# Patient Record
Sex: Female | Born: 1965 | Race: White | Hispanic: No | Marital: Married | Smoking: Current every day smoker
Health system: Southern US, Community
[De-identification: ages and names within clinical notes are randomized; demographics above are authoritative.]

## PROBLEM LIST (undated history)

## (undated) DIAGNOSIS — R011 Cardiac murmur, unspecified: Secondary | ICD-10-CM

## (undated) DIAGNOSIS — E119 Type 2 diabetes mellitus without complications: Secondary | ICD-10-CM

## (undated) DIAGNOSIS — I359 Nonrheumatic aortic valve disorder, unspecified: Secondary | ICD-10-CM

## (undated) DIAGNOSIS — G4733 Obstructive sleep apnea (adult) (pediatric): Secondary | ICD-10-CM

## (undated) DIAGNOSIS — E7800 Pure hypercholesterolemia, unspecified: Secondary | ICD-10-CM

## (undated) DIAGNOSIS — I10 Essential (primary) hypertension: Secondary | ICD-10-CM

## (undated) DIAGNOSIS — F419 Anxiety disorder, unspecified: Secondary | ICD-10-CM

## (undated) DIAGNOSIS — I351 Nonrheumatic aortic (valve) insufficiency: Secondary | ICD-10-CM

## (undated) HISTORY — DX: Nonrheumatic aortic valve disorder, unspecified: I35.9

## (undated) HISTORY — DX: Pure hypercholesterolemia, unspecified: E78.00

## (undated) HISTORY — DX: Type 2 diabetes mellitus without complications: E11.9

## (undated) HISTORY — PX: SHOULDER SURGERY: SHX246

## (undated) HISTORY — DX: Nonrheumatic aortic (valve) insufficiency: I35.1

## (undated) HISTORY — DX: Obstructive sleep apnea (adult) (pediatric): G47.33

## (undated) HISTORY — DX: Anxiety disorder, unspecified: F41.9

## (undated) HISTORY — DX: Essential (primary) hypertension: I10

## (undated) HISTORY — DX: Cardiac murmur, unspecified: R01.1

---

## 1999-01-13 ENCOUNTER — Other Ambulatory Visit: Admission: RE | Admit: 1999-01-13 | Discharge: 1999-01-13 | Payer: Self-pay | Admitting: Oral Surgery

## 1999-06-09 ENCOUNTER — Ambulatory Visit (HOSPITAL_COMMUNITY): Admission: RE | Admit: 1999-06-09 | Discharge: 1999-06-09 | Payer: Self-pay | Admitting: Gastroenterology

## 2000-09-22 ENCOUNTER — Other Ambulatory Visit: Admission: RE | Admit: 2000-09-22 | Discharge: 2000-09-22 | Payer: Self-pay | Admitting: Gynecology

## 2000-10-11 ENCOUNTER — Encounter: Payer: Self-pay | Admitting: Gynecology

## 2000-10-11 ENCOUNTER — Encounter: Admission: RE | Admit: 2000-10-11 | Discharge: 2000-10-11 | Payer: Self-pay | Admitting: Gynecology

## 2002-06-19 ENCOUNTER — Other Ambulatory Visit: Admission: RE | Admit: 2002-06-19 | Discharge: 2002-06-19 | Payer: Self-pay | Admitting: Gynecology

## 2004-07-09 ENCOUNTER — Other Ambulatory Visit: Admission: RE | Admit: 2004-07-09 | Discharge: 2004-07-09 | Payer: Self-pay | Admitting: Gynecology

## 2005-03-26 ENCOUNTER — Encounter: Admission: RE | Admit: 2005-03-26 | Discharge: 2005-03-26 | Payer: Self-pay | Admitting: Gynecology

## 2005-11-10 ENCOUNTER — Other Ambulatory Visit: Admission: RE | Admit: 2005-11-10 | Discharge: 2005-11-10 | Payer: Self-pay | Admitting: Gynecology

## 2006-06-03 ENCOUNTER — Encounter: Admission: RE | Admit: 2006-06-03 | Discharge: 2006-06-03 | Payer: Self-pay | Admitting: Gynecology

## 2007-05-11 ENCOUNTER — Emergency Department (HOSPITAL_COMMUNITY): Admission: EM | Admit: 2007-05-11 | Discharge: 2007-05-11 | Payer: Self-pay | Admitting: Emergency Medicine

## 2007-06-14 ENCOUNTER — Encounter: Admission: RE | Admit: 2007-06-14 | Discharge: 2007-06-14 | Payer: Self-pay | Admitting: Gynecology

## 2007-06-17 ENCOUNTER — Encounter: Admission: RE | Admit: 2007-06-17 | Discharge: 2007-06-17 | Payer: Self-pay | Admitting: Gynecology

## 2008-07-24 ENCOUNTER — Encounter: Admission: RE | Admit: 2008-07-24 | Discharge: 2008-07-24 | Payer: Self-pay | Admitting: Gynecology

## 2010-03-23 ENCOUNTER — Encounter: Payer: Self-pay | Admitting: Gynecology

## 2010-10-14 ENCOUNTER — Other Ambulatory Visit: Payer: Self-pay | Admitting: Gynecology

## 2010-10-14 DIAGNOSIS — Z1231 Encounter for screening mammogram for malignant neoplasm of breast: Secondary | ICD-10-CM

## 2010-10-17 ENCOUNTER — Ambulatory Visit
Admission: RE | Admit: 2010-10-17 | Discharge: 2010-10-17 | Disposition: A | Discharge status: Home / Self Care | Payer: Self-pay | Source: Ambulatory Visit | Attending: Gynecology | Admitting: Gynecology

## 2010-10-17 DIAGNOSIS — Z1231 Encounter for screening mammogram for malignant neoplasm of breast: Secondary | ICD-10-CM

## 2010-11-24 LAB — COMPREHENSIVE METABOLIC PANEL
ALT: 23
AST: 20
CO2: 25
Calcium: 9.9
Chloride: 108
Creatinine, Ser: 0.72
GFR calc non Af Amer: >60
Glucose, Bld: 90
Sodium: 142
Total Bilirubin: 0.4

## 2010-11-24 LAB — DIFFERENTIAL
Basophils Absolute: 0.1
Eosinophils Absolute: 0.1
Eosinophils Relative: 1
Lymphs Abs: 3.2
Neutrophils Relative %: 68

## 2010-11-24 LAB — CBC
Hemoglobin: 15
MCHC: 34.4
MCV: 91.4
RBC: 4.78
WBC: 13.1 — ABNORMAL HIGH

## 2012-04-28 ENCOUNTER — Other Ambulatory Visit: Payer: Self-pay | Admitting: Family Medicine

## 2012-04-28 ENCOUNTER — Other Ambulatory Visit (HOSPITAL_COMMUNITY)
Admission: RE | Admit: 2012-04-28 | Discharge: 2012-04-28 | Disposition: A | Discharge status: Home / Self Care | Payer: Self-pay | Source: Ambulatory Visit | Attending: Family Medicine | Admitting: Family Medicine

## 2012-04-28 DIAGNOSIS — Z01419 Encounter for gynecological examination (general) (routine) without abnormal findings: Secondary | ICD-10-CM | POA: Insufficient documentation

## 2012-11-21 ENCOUNTER — Other Ambulatory Visit: Payer: Self-pay | Admitting: Cardiology

## 2012-11-21 DIAGNOSIS — Z79899 Other long term (current) drug therapy: Secondary | ICD-10-CM

## 2012-11-21 DIAGNOSIS — E7800 Pure hypercholesterolemia, unspecified: Secondary | ICD-10-CM

## 2012-11-30 ENCOUNTER — Other Ambulatory Visit: Payer: Self-pay

## 2012-12-06 ENCOUNTER — Other Ambulatory Visit (INDEPENDENT_AMBULATORY_CARE_PROVIDER_SITE_OTHER): Payer: BC Managed Care – PPO

## 2012-12-06 DIAGNOSIS — E7800 Pure hypercholesterolemia, unspecified: Secondary | ICD-10-CM

## 2012-12-06 DIAGNOSIS — Z79899 Other long term (current) drug therapy: Secondary | ICD-10-CM

## 2012-12-06 LAB — ALT: ALT: 18 U/L (ref 0–35)

## 2012-12-07 ENCOUNTER — Telehealth: Payer: Self-pay | Admitting: General Surgery

## 2012-12-07 LAB — NMR LIPOPROFILE WITH LIPIDS
Cholesterol, Total: 83 mg/dL (ref <200)
HDL Particle Number: 31.3 umol/L (ref >=30.5)
HDL Size: 8.8 nm — ABNORMAL LOW (ref >=9.2)
HDL-C: 36 mg/dL — ABNORMAL LOW (ref >=40)
LDL (calc): 28 mg/dL (ref <100)
LDL Particle Number: 314 nmol/L (ref <1000)
LDL Size: 19.6 nm — ABNORMAL LOW (ref >20.5)
LP-IR Score: 37 (ref <=45)
Large HDL-P: <1.3 umol/L — ABNORMAL LOW (ref >=4.8)
Large VLDL-P: <0.8 nmol/L (ref <=2.7)
Small LDL Particle Number: 200 nmol/L (ref <=527)
Triglycerides: 97 mg/dL (ref <150)
VLDL Size: 43.4 nm (ref <=46.6)

## 2012-12-07 NOTE — Telephone Encounter (Signed)
LVM for pt to return call

## 2012-12-08 ENCOUNTER — Telehealth: Payer: Self-pay | Admitting: General Surgery

## 2012-12-08 ENCOUNTER — Telehealth: Payer: Self-pay | Admitting: Cardiology

## 2012-12-08 DIAGNOSIS — E7800 Pure hypercholesterolemia, unspecified: Secondary | ICD-10-CM

## 2012-12-08 DIAGNOSIS — Z79899 Other long term (current) drug therapy: Secondary | ICD-10-CM

## 2012-12-08 MED ORDER — ROSUVASTATIN CALCIUM 10 MG PO TABS: 10.0000 mg | ORAL_TABLET | Freq: Every day | ORAL | Status: DC

## 2012-12-08 NOTE — Telephone Encounter (Signed)
Pt is aware. Rx sent in for pt and lab work set up for 6 months out.

## 2012-12-08 NOTE — Telephone Encounter (Signed)
Follow Up: ° ° ° ° °Pt returning your call. °

## 2012-12-08 NOTE — Telephone Encounter (Signed)
Message copied by Nita Sells on Thu Dec 08, 2012  4:52 PM ------      Message from: SMART, Gaspar Skeeters      Created: Wed Dec 07, 2012  5:06 PM       RF: Diabetes, family h/o CAD, HTN, smoker - LDL-P goal < 1000.      Meds:  Crestor 40 mg qd, Carlson's fish oil 6 g/d.      Intolerant:  Trilipix, Niaspan.      Couldn't afford:  Lovaza.      Cholesterol has dropped significantly on Crestor 40 mg qd, and b/c cholesterol is so low, will reduce Crestor to just 1/4 pill daily (thus Crestor 10 mg daily).      ALT normal      Plan:      1.  Reduce Crestor to 10 mg qd (okay to take 1/4 pill of the 40 mg pill).      2.  Recheck lipid panel and hepatic panel (not an NMR) in 6 months.      Please notify patient, update meds, and set up labs. Thanks.       ------

## 2012-12-09 ENCOUNTER — Other Ambulatory Visit: Payer: Self-pay | Admitting: General Surgery

## 2012-12-09 MED ORDER — ROSUVASTATIN CALCIUM 10 MG PO TABS: 10.0000 mg | ORAL_TABLET | Freq: Every day | ORAL | Status: DC

## 2012-12-09 NOTE — Telephone Encounter (Signed)
Follow up     Status of refill medication that was suppose to be called in. Pharmacy  stating it was not called in.

## 2012-12-09 NOTE — Telephone Encounter (Signed)
Pt is aware. rx has been sent in. 

## 2013-02-28 ENCOUNTER — Encounter: Payer: Self-pay | Admitting: *Deleted

## 2013-02-28 ENCOUNTER — Encounter: Payer: Self-pay | Admitting: Cardiology

## 2013-02-28 DIAGNOSIS — F419 Anxiety disorder, unspecified: Secondary | ICD-10-CM | POA: Insufficient documentation

## 2013-02-28 DIAGNOSIS — G4733 Obstructive sleep apnea (adult) (pediatric): Secondary | ICD-10-CM | POA: Insufficient documentation

## 2013-02-28 DIAGNOSIS — I10 Essential (primary) hypertension: Secondary | ICD-10-CM | POA: Insufficient documentation

## 2013-02-28 DIAGNOSIS — I351 Nonrheumatic aortic (valve) insufficiency: Secondary | ICD-10-CM | POA: Insufficient documentation

## 2013-02-28 DIAGNOSIS — E7800 Pure hypercholesterolemia, unspecified: Secondary | ICD-10-CM | POA: Insufficient documentation

## 2013-02-28 DIAGNOSIS — E119 Type 2 diabetes mellitus without complications: Secondary | ICD-10-CM | POA: Insufficient documentation

## 2013-03-01 ENCOUNTER — Encounter: Payer: Self-pay | Admitting: General Surgery

## 2013-03-21 ENCOUNTER — Ambulatory Visit (INDEPENDENT_AMBULATORY_CARE_PROVIDER_SITE_OTHER): Payer: BC Managed Care – PPO | Admitting: Cardiology

## 2013-03-21 ENCOUNTER — Encounter: Payer: Self-pay | Admitting: Cardiology

## 2013-03-21 VITALS — BP 138/76 | HR 82 | Ht 62.0 in | Wt 163.0 lb

## 2013-03-21 DIAGNOSIS — E7800 Pure hypercholesterolemia, unspecified: Secondary | ICD-10-CM

## 2013-03-21 DIAGNOSIS — I10 Essential (primary) hypertension: Secondary | ICD-10-CM

## 2013-03-21 DIAGNOSIS — I359 Nonrheumatic aortic valve disorder, unspecified: Secondary | ICD-10-CM

## 2013-03-21 DIAGNOSIS — G4733 Obstructive sleep apnea (adult) (pediatric): Secondary | ICD-10-CM

## 2013-03-21 DIAGNOSIS — I351 Nonrheumatic aortic (valve) insufficiency: Secondary | ICD-10-CM

## 2013-03-21 NOTE — Progress Notes (Signed)
[address] [place], [place]  [address] Phone: [phone]) [phone] Fax:  [phone]) [phone]  Date:  03/21/2013   ID:  Cheryl Schultz Pocock, DOB 1965-12-03, MRN [id number]  PCP:  Irving CopasHACKER,ROBERT KELLER, MD  Cardiologist:  Armanda Magicraci Nihar Klus, MD   History of Present Illness: Cheryl Schultz Kendall is a 48 y.o. female with mild to moderate AI, mild OSA on CPAP and HTN who presents today for followup.  She is doing well.  She tolerates her CPAP device well.  She tolerates her full face mask and feels the pressure is adequate.  She feels rested in the am and has no daytime sleepiness.  She denies any chest pain, SOB, DOE, LE edema, palpitations or syncope.   Wt Readings from Last 3 Encounters:  03/21/13 163 lb (73.936 kg)  09/22/12 167 lb 9.6 oz (76.023 kg)     Past Medical History  Diagnosis Date  . Hypercholesteremia   . Anxiety   . OSA (obstructive sleep apnea)     mlid obstructive sleep apnea with AHI 11.7/hr and titrated on CPAP to 11cm H2O  . HTN (hypertension)   . Diabetes   . Aortic insufficiency - mild to moderate     Current Outpatient Prescriptions  Medication Sig Dispense Refill  . amLODipine (NORVASC) 10 MG tablet Take 10 mg by mouth daily.      Marland Kitchen. aspirin 81 MG tablet Take 81 mg by mouth daily.      . Calcium Citrate-Vitamin D (CITRACAL + D PO) Take 2 tablets by mouth daily.      . Cholecalciferol (VITAMIN D) 2000 UNITS CAPS Take by mouth.      . estradiol (VIVELLE-DOT) 0.1 MG/24HR patch Place 1 patch onto the skin 2 (two) times a week.      . fluticasone (FLONASE) 50 MCG/ACT nasal spray Place into both nostrils as needed.       Marland Kitchen. ibuprofen (ADVIL,MOTRIN) 200 MG tablet Take 200 mg by mouth every 6 (six) hours as needed.      . progesterone (PROMETRIUM) 200 MG capsule Take 200 mg by mouth daily. One a day the first 12 days of every other month.      . ramipril (ALTACE) 10 MG capsule Take 10 mg by mouth daily.      . rosuvastatin (CRESTOR) 10 MG tablet Take 1 tablet (10 mg total) by mouth  daily.  30 tablet  11   No current facility-administered medications for this visit.    Allergies:    Allergies  Allergen Reactions  . Sulfa Antibiotics Hives and Shortness Of Breath  . Trilipix [Choline Fenofibrate] Hives and Shortness Of Breath  . Niaspan [Niacin Er] Hives    Social History:  The patient  reports that she has been smoking Cigarettes.  She has been smoking about 1.00 pack per day. She does not have any smokeless tobacco history on file. She reports that she does not drink alcohol or use illicit drugs.   Family History:  The patient's family history includes Diabetes in her father; Heart disease in her father; Hyperlipidemia in her father.   ROS:  Please see the history of present illness.      All other systems reviewed and negative.   PHYSICAL EXAM: VS:  BP 138/76  Pulse 82  Ht 5\' 2"  (1.575 m)  Wt 163 lb (73.936 kg)  BMI 29.81 kg/m2 Well nourished, well developed, in no acute distress HEENT: normal Neck: no JVD Cardiac:  normal S1, S2; RRR; no murmur Lungs:  clear to auscultation bilaterally, no wheezing, rhonchi or rales Abd: soft, nontender, no hepatomegaly Ext: no edema Skin: warm and dry Neuro:  CNs 2-12 intact, no focal abnormalities noted       ASSESSMENT AND PLAN:  1. OSA on CPAP  - I will get a download from her DME 2. Mild to moderate AI   - scheduled for echo next month 3.  HTN   - continue amlodipine/ramipril  - check BMET 4.  Obesity 5.  Dyslipidemia - at goal  - continue Crestor  followup with me in 6 months  Signed, Armanda Magic, MD 03/21/2013 4:01 PM

## 2013-03-21 NOTE — Patient Instructions (Addendum)
Your physician recommends that you continue on your current medications as directed. Please refer to the Current Medication list given to you today.  We Sent a request to [facility] stating we need a download for your CPAP machine  Your physician recommends that you go to the lab today for a BMET  Your physician wants you to follow-up in: 6 Months with Dr Sherlyn Lickurner You will receive a reminder letter in the mail two months in advance. If you don't receive a letter, please call our office to schedule the follow-up appointment.

## 2013-03-22 LAB — BASIC METABOLIC PANEL
BUN: 16 mg/dL (ref 6–23)
CHLORIDE: 107 meq/L (ref 96–112)
CO2: 28 meq/L (ref 19–32)
Calcium: 9.8 mg/dL (ref 8.4–10.5)
Creatinine, Ser: 0.9 mg/dL (ref 0.4–1.2)
GFR: 74.78 mL/min (ref >60.00)
Glucose, Bld: 99 mg/dL (ref 70–99)
POTASSIUM: 5.1 meq/L (ref 3.5–5.1)
SODIUM: 141 meq/L (ref 135–145)

## 2013-03-23 ENCOUNTER — Encounter: Payer: Self-pay | Admitting: General Surgery

## 2013-04-14 ENCOUNTER — Telehealth: Payer: Self-pay | Admitting: Cardiology

## 2013-04-14 MED ORDER — RAMIPRIL 10 MG PO CAPS: 10.0000 mg | ORAL_CAPSULE | Freq: Every day | ORAL | Status: DC

## 2013-04-14 NOTE — Telephone Encounter (Signed)
New Prob    Pt has a questions regarding her medication. Please call.

## 2013-04-14 NOTE — Telephone Encounter (Signed)
Number listed in message has been d/ced.  Called other number and left message for pt to call back to discuss concerns about her medications.

## 2013-04-14 NOTE — Telephone Encounter (Signed)
Needs refill on Ramipril 10 mg

## 2013-04-14 NOTE — Telephone Encounter (Signed)
Follow up ° ° ° ° °Returning a nurses call °

## 2013-04-18 ENCOUNTER — Other Ambulatory Visit (HOSPITAL_COMMUNITY): Payer: BC Managed Care – PPO

## 2013-05-05 ENCOUNTER — Ambulatory Visit (HOSPITAL_COMMUNITY): Payer: BC Managed Care – PPO | Attending: Cardiology | Admitting: Radiology

## 2013-05-05 ENCOUNTER — Other Ambulatory Visit (HOSPITAL_COMMUNITY): Payer: Self-pay | Admitting: Radiology

## 2013-05-05 ENCOUNTER — Encounter: Payer: Self-pay | Admitting: Cardiology

## 2013-05-05 ENCOUNTER — Other Ambulatory Visit (HOSPITAL_COMMUNITY): Payer: BC Managed Care – PPO

## 2013-05-05 DIAGNOSIS — R011 Cardiac murmur, unspecified: Secondary | ICD-10-CM

## 2013-05-05 DIAGNOSIS — I359 Nonrheumatic aortic valve disorder, unspecified: Secondary | ICD-10-CM

## 2013-05-05 NOTE — Progress Notes (Signed)
Echocardiogram Performed. 

## 2013-05-08 ENCOUNTER — Encounter: Payer: Self-pay | Admitting: Cardiology

## 2013-05-08 ENCOUNTER — Encounter: Payer: Self-pay | Admitting: General Surgery

## 2013-06-08 ENCOUNTER — Other Ambulatory Visit: Payer: BC Managed Care – PPO

## 2013-06-08 ENCOUNTER — Other Ambulatory Visit: Payer: Self-pay | Admitting: Obstetrics and Gynecology

## 2013-07-28 ENCOUNTER — Other Ambulatory Visit (INDEPENDENT_AMBULATORY_CARE_PROVIDER_SITE_OTHER): Payer: BC Managed Care – PPO

## 2013-07-28 DIAGNOSIS — Z79899 Other long term (current) drug therapy: Secondary | ICD-10-CM

## 2013-07-28 DIAGNOSIS — E7800 Pure hypercholesterolemia, unspecified: Secondary | ICD-10-CM

## 2013-07-28 LAB — LIPID PANEL
CHOL/HDL RATIO: 3
Cholesterol: 121 mg/dL (ref 0–200)
HDL: 42.4 mg/dL (ref >39.00)
LDL CALC: 44 mg/dL (ref 0–99)
TRIGLYCERIDES: 173 mg/dL — AB (ref 0.0–149.0)
VLDL: 34.6 mg/dL (ref 0.0–40.0)

## 2013-07-28 LAB — HEPATIC FUNCTION PANEL
ALBUMIN: 4 g/dL (ref 3.5–5.2)
ALT: 22 U/L (ref 0–35)
AST: 16 U/L (ref 0–37)
Alkaline Phosphatase: 37 U/L — ABNORMAL LOW (ref 39–117)
Bilirubin, Direct: 0 mg/dL (ref 0.0–0.3)
Total Bilirubin: 0.3 mg/dL (ref 0.2–1.2)
Total Protein: 6.9 g/dL (ref 6.0–8.3)

## 2013-08-10 ENCOUNTER — Telehealth: Payer: Self-pay | Admitting: Cardiology

## 2013-08-10 DIAGNOSIS — E7800 Pure hypercholesterolemia, unspecified: Secondary | ICD-10-CM

## 2013-08-10 NOTE — Telephone Encounter (Signed)
meds updated and labs ordered.

## 2013-08-10 NOTE — Telephone Encounter (Signed)
Message copied by Theda Sers on Thu Aug 10, 2013  9:34 AM ------      Message from: SMART, Gaspar Skeeters      Created: Tue Aug 01, 2013 10:04 AM       RF: Diabetes, family h/o CAD, HTN, smoker - LDL-P goal < 1000.      Meds:  Crestor 10 mg qd, Carlson's fish oil 6 g/d.      Intolerant:  Trilipix, Niaspan.      Couldn't afford:  Lovaza.      Crestor reduced from 40 mg down to 10 mg qd six months ago due to LDL of 28, and LDL-P 314 at that time.        LDL still well controlled on Crestor 10 mg qd, and non-HDL < 100.   TG trending up slightly, will watch this.      Plan:      1.  No change in meds.  Make sure she is taking fish oil 6 per day.      2.  Limit sugar and carbs in diet.      3.  Recheck lipid panel and hepatic panel in 6 months.      Please notify patient,  And set up labs. Thanks.       ------

## 2013-10-06 ENCOUNTER — Other Ambulatory Visit: Payer: Self-pay | Admitting: *Deleted

## 2013-10-06 MED ORDER — AMLODIPINE BESYLATE 10 MG PO TABS: 10.0000 mg | ORAL_TABLET | Freq: Every day | ORAL | Status: DC

## 2013-10-11 ENCOUNTER — Encounter: Payer: Self-pay | Admitting: Cardiology

## 2013-12-04 ENCOUNTER — Other Ambulatory Visit: Payer: Self-pay | Admitting: *Deleted

## 2013-12-04 MED ORDER — AMLODIPINE BESYLATE 10 MG PO TABS: 10.0000 mg | ORAL_TABLET | Freq: Every day | ORAL | Status: DC

## 2013-12-04 MED ORDER — ROSUVASTATIN CALCIUM 10 MG PO TABS: 10.0000 mg | ORAL_TABLET | Freq: Every day | ORAL | Status: DC

## 2013-12-19 ENCOUNTER — Ambulatory Visit: Payer: BC Managed Care – PPO | Admitting: Cardiology

## 2014-01-03 ENCOUNTER — Other Ambulatory Visit: Payer: Self-pay | Admitting: Cardiology

## 2014-01-04 ENCOUNTER — Other Ambulatory Visit: Payer: Self-pay

## 2014-01-04 MED ORDER — ROSUVASTATIN CALCIUM 10 MG PO TABS: 10.0000 mg | ORAL_TABLET | Freq: Every day | ORAL | Status: DC

## 2014-01-04 MED ORDER — AMLODIPINE BESYLATE 10 MG PO TABS: 10.0000 mg | ORAL_TABLET | Freq: Every day | ORAL | Status: DC

## 2014-01-12 ENCOUNTER — Encounter: Payer: Self-pay | Admitting: Cardiology

## 2014-01-12 ENCOUNTER — Ambulatory Visit (INDEPENDENT_AMBULATORY_CARE_PROVIDER_SITE_OTHER): Payer: BC Managed Care – PPO | Admitting: *Deleted

## 2014-01-12 ENCOUNTER — Ambulatory Visit (INDEPENDENT_AMBULATORY_CARE_PROVIDER_SITE_OTHER): Payer: BC Managed Care – PPO | Admitting: Cardiology

## 2014-01-12 VITALS — BP 130/72 | HR 89 | Ht 62.0 in | Wt 159.6 lb

## 2014-01-12 DIAGNOSIS — G4733 Obstructive sleep apnea (adult) (pediatric): Secondary | ICD-10-CM

## 2014-01-12 DIAGNOSIS — I10 Essential (primary) hypertension: Secondary | ICD-10-CM

## 2014-01-12 DIAGNOSIS — E7800 Pure hypercholesterolemia, unspecified: Secondary | ICD-10-CM

## 2014-01-12 DIAGNOSIS — Z23 Encounter for immunization: Secondary | ICD-10-CM

## 2014-01-12 DIAGNOSIS — I351 Nonrheumatic aortic (valve) insufficiency: Secondary | ICD-10-CM

## 2014-01-12 NOTE — Progress Notes (Signed)
[address] [place], [place]  [address] Phone: [phone] Fax:  [phone]  Date:  01/12/2014   ID:  Cheryl Carpeniane Flax, DOB 22-May-1965, MRN [id number]  PCP:  Irving CopasHACKER,ROBERT KELLER, MD  Cardiologist:  Armanda Magicraci Yasaman Kolek, MD    History of Present Illness: Cheryl CarpenDiane Schultz is a 48 y.o. female with mild AI, mild OSA on CPAP, dyslipidemia and HTN who presents today for followup. She is doing well. She tolerates her CPAP device well. She tolerates her full face mask and feels the pressure is adequate. She feels rested in the am for the most part and has no daytime sleepiness. She denies any chest pain, SOB, DOE, LE edema, palpitations or syncope.   Wt Readings from Last 3 Encounters:  01/12/14 159 lb 9.6 oz (72.394 kg)  03/21/13 163 lb (73.936 kg)  09/22/12 167 lb 9.6 oz (76.023 kg)     Past Medical History  Diagnosis Date  . Hypercholesteremia   . Anxiety   . OSA (obstructive sleep apnea)     mlid obstructive sleep apnea with AHI 11.7/hr and titrated on CPAP to 11cm H2O  . HTN (hypertension)   . Aortic valve disorder   . Heart murmur   . Diabetes   . Aortic insufficiency     mild by echo 3/15    Current Outpatient Prescriptions  Medication Sig Dispense Refill  . amLODipine (NORVASC) 10 MG tablet Take 1 tablet (10 mg total) by mouth daily. 30 tablet 0  . Calcium Citrate-Vitamin D (CITRACAL + D PO) Take 2 tablets by mouth daily.    . Cholecalciferol (VITAMIN D) 2000 UNITS CAPS Take by mouth.    . fluticasone (FLONASE) 50 MCG/ACT nasal spray Place into both nostrils as needed.     Marland Kitchen. ibuprofen (ADVIL,MOTRIN) 200 MG tablet Take 200 mg by mouth every 6 (six) hours as needed.    . Omega-3 Fatty Acids (FISH OIL) 1000 MG CAPS Take 7,000 mg by mouth daily.    Marland Kitchen. PARoxetine (PAXIL) 20 MG tablet Take 20 mg by mouth daily.    . ramipril (ALTACE) 10 MG capsule Take 1 capsule (10 mg total) by mouth daily. 30 capsule 11  . rosuvastatin (CRESTOR) 10 MG tablet Take 1 tablet (10 mg  total) by mouth daily. 30 tablet 0   No current facility-administered medications for this visit.    Allergies:    Allergies  Allergen Reactions  . Sulfa Antibiotics Hives and Shortness Of Breath  . Trilipix [Choline Fenofibrate] Hives and Shortness Of Breath  . Niaspan [Niacin Er] Hives    Social History:  The patient  reports that she has been smoking Cigarettes.  She has been smoking about 1.00 pack per day. She does not have any smokeless tobacco history on file. She reports that she does not drink alcohol or use illicit drugs.   Family History:  The patient's family history includes Diabetes in her father; Heart disease in her father; Hyperlipidemia in her father.   ROS:  Please see the history of present illness.      All other systems reviewed and negative.   PHYSICAL EXAM: VS:  BP 130/72 mmHg  Pulse 89  Ht 5\' 2"  (1.575 m)  Wt 159 lb 9.6 oz (72.394 kg)  BMI 29.18 kg/m2  SpO2 99% Well nourished, well developed, in no acute distress HEENT: normal Neck: no JVD Cardiac:  normal S1, S2; RRR; 1/6 SM at RUSB Lungs:  clear to auscultation bilaterally, no wheezing, rhonchi or rales Abd:  soft, nontender, no hepatomegaly Ext: no edema Skin: warm and dry Neuro:  CNs 2-12 intact, no focal abnormalities noted  ASSESSMENT AND PLAN:  1.  OSA on CPAP - I will get a download from her DME 2.  Mild  AI/MR by echo 3/15 3. HTN- well controlled - continue amlodipine/ramipril - check BMET 4. Obesity 5. Dyslipidemia - LDL at goal - continue Crestor             - check FLP and ALT 6.  She received flu shot in our office today  followup with me in 6 months      Signed, Armanda Magicraci Delany Steury, MD [facility] 01/12/2014 3:41 PM

## 2014-01-12 NOTE — Patient Instructions (Signed)
Your physician wants you to follow-up in: 6 months with Dr. Turner. You will receive a reminder letter in the mail two months in advance. If you don't receive a letter, please call our office to schedule the follow-up appointment.  

## 2014-01-31 ENCOUNTER — Other Ambulatory Visit (INDEPENDENT_AMBULATORY_CARE_PROVIDER_SITE_OTHER): Payer: BC Managed Care – PPO | Admitting: *Deleted

## 2014-01-31 DIAGNOSIS — E7800 Pure hypercholesterolemia, unspecified: Secondary | ICD-10-CM

## 2014-01-31 DIAGNOSIS — I10 Essential (primary) hypertension: Secondary | ICD-10-CM

## 2014-02-01 ENCOUNTER — Other Ambulatory Visit: Payer: Self-pay | Admitting: Cardiology

## 2014-02-01 LAB — LIPID PANEL
CHOLESTEROL: 138 mg/dL (ref 0–200)
HDL: 35.2 mg/dL — ABNORMAL LOW (ref >39.00)
NonHDL: 102.8
Total CHOL/HDL Ratio: 4
Triglycerides: 204 mg/dL — ABNORMAL HIGH (ref 0.0–149.0)
VLDL: 40.8 mg/dL — ABNORMAL HIGH (ref 0.0–40.0)

## 2014-02-01 LAB — HEPATIC FUNCTION PANEL
ALK PHOS: 44 U/L (ref 39–117)
ALT: 25 U/L (ref 0–35)
AST: 21 U/L (ref 0–37)
Albumin: 4.1 g/dL (ref 3.5–5.2)
BILIRUBIN DIRECT: 0.1 mg/dL (ref 0.0–0.3)
BILIRUBIN TOTAL: 0.5 mg/dL (ref 0.2–1.2)
TOTAL PROTEIN: 6.8 g/dL (ref 6.0–8.3)

## 2014-02-01 LAB — LDL CHOLESTEROL, DIRECT: Direct LDL: 64.7 mg/dL

## 2014-02-01 LAB — BASIC METABOLIC PANEL
BUN: 21 mg/dL (ref 6–23)
CO2: 27 mEq/L (ref 19–32)
CREATININE: 0.7 mg/dL (ref 0.4–1.2)
Calcium: 9.2 mg/dL (ref 8.4–10.5)
Chloride: 104 mEq/L (ref 96–112)
GFR: 90.02 mL/min (ref >60.00)
Glucose, Bld: 106 mg/dL — ABNORMAL HIGH (ref 70–99)
Potassium: 3.5 mEq/L (ref 3.5–5.1)
Sodium: 135 mEq/L (ref 135–145)

## 2014-02-02 ENCOUNTER — Telehealth: Payer: Self-pay | Admitting: Cardiology

## 2014-02-02 MED ORDER — AMLODIPINE BESYLATE 10 MG PO TABS: 10.0000 mg | ORAL_TABLET | Freq: Every day | ORAL | Status: DC

## 2014-02-02 MED ORDER — ROSUVASTATIN CALCIUM 10 MG PO TABS: 10.0000 mg | ORAL_TABLET | Freq: Every day | ORAL | Status: DC

## 2014-02-02 NOTE — Telephone Encounter (Signed)
Returned patient's call. West PughKati Kemp RN called patient earlier to let her know about her labs. Reported to patient the doctor's note as follows and her results:   Notes Recorded by Quintella Reichertraci R Turner, MD on 02/01/2014 at 4:33 PM Lipids still not at goal. Please forward to lipid clinic for further recommendations.  Spoke with Weston BrassSally Putt the pharmacist in lipid clinic, she will contact patient next week and okay to continue with current medications. Patient requested for refills on Crestor and Amlodipine. Refills sent to pharmacy of patient's choice.  Will forward to Audrie LiaSally Earl Pharmacist.  Cindi CarbonPamela Pate RN

## 2014-02-02 NOTE — Telephone Encounter (Signed)
New Msg   Pt returning call  States to contact her after lunch hrs at  [phone].

## 2014-02-05 NOTE — Telephone Encounter (Signed)
Spoke with pt.  Discussed lipid panel results.  Based on history, LDL goal <130 and non-HDL goal<160.  SZOXW<960he is currently meeting both of these on Crestor 10mg  daily.  She ate extra desserts on Thanksgiving which likely contributed to her elevated TG.  Will continue her same medications and follow up in 6 months.

## 2014-02-08 ENCOUNTER — Telehealth: Payer: Self-pay | Admitting: Cardiology

## 2014-02-08 NOTE — Telephone Encounter (Signed)
New message ° ° ° ° °Returning Katy's call °

## 2014-02-09 NOTE — Telephone Encounter (Signed)
I left a message on the patient's identified voice mail about her lipid results (triglycerides 204) and that I was forwarding to Audrie LiaSally Earl, Pharm D for review. We will call her back with further recommendations once reviewed. I asked that she call back with any questions in the interim.

## 2014-03-01 ENCOUNTER — Telehealth: Payer: Self-pay | Admitting: Cardiology

## 2014-03-01 DIAGNOSIS — E7800 Pure hypercholesterolemia, unspecified: Secondary | ICD-10-CM

## 2014-03-01 NOTE — Telephone Encounter (Signed)
Patient informed of results and verbal understanding expressed.  Fasting lab appointment scheduled for May 31.

## 2014-03-01 NOTE — Telephone Encounter (Signed)
New Msg         Pt states she was informed to call today. Please call back.

## 2014-03-01 NOTE — Telephone Encounter (Signed)
-----   Message from Evie LacksSally R Earl, [facility] sent at 02/12/2014 11:56 AM EST ----- Discussed results with pt on 12/7 (see phone note).  TG likely elevated due to dietary changes.  LDL and non-HDL still at goal.  Would suggest continuing current therapy and rechecking labs in 6 months.

## 2014-04-09 ENCOUNTER — Other Ambulatory Visit: Payer: Self-pay | Admitting: Cardiology

## 2014-06-05 ENCOUNTER — Other Ambulatory Visit: Payer: Self-pay | Admitting: Obstetrics and Gynecology

## 2014-06-07 LAB — CYTOLOGY - PAP

## 2014-07-11 ENCOUNTER — Ambulatory Visit: Payer: Self-pay | Admitting: Podiatrist

## 2014-07-26 ENCOUNTER — Encounter: Payer: Self-pay | Admitting: Podiatry

## 2014-07-26 ENCOUNTER — Ambulatory Visit (INDEPENDENT_AMBULATORY_CARE_PROVIDER_SITE_OTHER): Payer: 59

## 2014-07-26 ENCOUNTER — Ambulatory Visit (INDEPENDENT_AMBULATORY_CARE_PROVIDER_SITE_OTHER): Payer: 59 | Admitting: Podiatry

## 2014-07-26 VITALS — BP 134/76 | HR 82 | Resp 12

## 2014-07-26 DIAGNOSIS — L84 Corns and callosities: Secondary | ICD-10-CM

## 2014-07-26 DIAGNOSIS — M216X9 Other acquired deformities of unspecified foot: Secondary | ICD-10-CM

## 2014-07-26 DIAGNOSIS — M779 Enthesopathy, unspecified: Secondary | ICD-10-CM | POA: Diagnosis not present

## 2014-07-26 DIAGNOSIS — Q667 Congenital pes cavus: Secondary | ICD-10-CM | POA: Diagnosis not present

## 2014-07-26 NOTE — Progress Notes (Signed)
   Subjective:    Patient ID: Cheryl Schultz, female    DOB: 1965-12-17, 49 y.o.   MRN: [id number]  HPI Calluses on Left foot and great toe, long term issue but bottom of the foot is causing pain   Review of Systems  HENT: Positive for sinus pressure.   Respiratory: Positive for apnea.   Cardiovascular: Positive for palpitations.  Neurological: Positive for headaches.       Objective:   Physical Exam        Assessment & Plan:

## 2014-07-27 ENCOUNTER — Telehealth: Payer: Self-pay | Admitting: Cardiology

## 2014-07-27 NOTE — Telephone Encounter (Signed)
Patient st she had lipids drawn at her PCP's office. She st she will have them forward over to us. Per patient, lab appointment cancelled.

## 2014-07-27 NOTE — Telephone Encounter (Signed)
New Message  Pt called to cancel lab appt. Pt states- had lab work done @ last PCP appt: Dr Valentina LucksGriffin. Pt requested to speak w/ RN to go over what may have been taken there and what Dr. Mayford Knifeurner wants in the future. Please call back and dscuss.

## 2014-07-28 NOTE — Progress Notes (Signed)
Subjective:     Patient ID: Cheryl Schultz, female   DOB: 12-12-1965, 49 y.o.   MRN: [id number]  HPI patient presents with severe lesion plantar aspect left foot that's very tender when pressed. She is a long-term diabetic and I would consider this somewhat of a pre-ulcerated of type lesion   Review of Systems  All other systems reviewed and are negative.      Objective:   Physical Exam  Constitutional: She is oriented to person, place, and time.  Cardiovascular: Intact distal pulses.   Musculoskeletal: Normal range of motion.  Neurological: She is oriented to person, place, and time.  Skin: Skin is warm.  Nursing note and vitals reviewed.  neurovascular status intact muscle strength adequate with range of motion subtalar midtarsal joint within normal limits. Patient's noted to have a severe keratotic lesion sub-fourth metatarsal left that's painful when pressed and makes walking difficult. Patient has tried different activities without relief of symptoms     Assessment:     Patient has reasonable neurovascular status but does have severe plantar keratotic lesion and diabetes that is relatively well controlled    Plan:     H&P and diabetic education rendered. Today aggressive debridement a lesion was accomplished and I've recommended orthotics to try to disperse weight off of this and hopefully prevent future ulcerations or worsening of the condition. Scanned for custom orthotic devices

## 2014-07-31 ENCOUNTER — Other Ambulatory Visit: Payer: BC Managed Care – PPO

## 2014-08-02 ENCOUNTER — Other Ambulatory Visit: Payer: Self-pay | Admitting: Cardiology

## 2014-08-02 ENCOUNTER — Ambulatory Visit: Payer: Self-pay | Admitting: Cardiology

## 2014-08-17 ENCOUNTER — Ambulatory Visit: Payer: 59 | Admitting: *Deleted

## 2014-08-17 DIAGNOSIS — M779 Enthesopathy, unspecified: Secondary | ICD-10-CM

## 2014-08-17 NOTE — Progress Notes (Signed)
Patient ID: Cheryl Schultz, female   DOB: August 07, 1965, 49 y.o.   MRN: [id number] PICKING UP INSERTS

## 2014-08-17 NOTE — Patient Instructions (Signed)

## 2014-08-31 ENCOUNTER — Telehealth: Payer: Self-pay | Admitting: *Deleted

## 2014-08-31 NOTE — Telephone Encounter (Signed)
Pt presents for exchange of initially wrong orthotics and picking up the proper orthotics.  I gave the correct pair of orthotics labeled with her name, and pt began discussing her name didn't have two "n" and printed on the proper orthotic bag.  I checked the pt's MRN# on the bag and it was identical to the pt's and pt's birthdate.  She wanted an explanation for the error and I told her I would have our office manager call her next week.

## 2014-09-04 ENCOUNTER — Other Ambulatory Visit: Payer: Self-pay | Admitting: Cardiology

## 2014-09-05 ENCOUNTER — Other Ambulatory Visit: Payer: Self-pay | Admitting: *Deleted

## 2014-09-05 ENCOUNTER — Telehealth: Payer: Self-pay | Admitting: Cardiology

## 2014-09-05 MED ORDER — ROSUVASTATIN CALCIUM 10 MG PO TABS: 10.0000 mg | ORAL_TABLET | Freq: Every day | ORAL | Status: DC

## 2014-09-05 MED ORDER — RAMIPRIL 10 MG PO CAPS: 10.0000 mg | ORAL_CAPSULE | Freq: Every day | ORAL | Status: DC

## 2014-09-05 NOTE — Telephone Encounter (Signed)
New message       Pt is due to see Dr Mayford Knifeurner on Monday.  Can you send [facility] order to [facility] to get the CPAP down and pt will pick it up prior to appt.  Let pt know

## 2014-09-05 NOTE — Telephone Encounter (Signed)
Patient will be bringing her chip to her appointment on Monday

## 2014-09-10 ENCOUNTER — Encounter: Payer: Self-pay | Admitting: Cardiology

## 2014-09-10 ENCOUNTER — Ambulatory Visit (INDEPENDENT_AMBULATORY_CARE_PROVIDER_SITE_OTHER): Payer: 59 | Admitting: Cardiology

## 2014-09-10 VITALS — BP 120/56 | HR 86 | Ht 62.0 in | Wt 157.8 lb

## 2014-09-10 DIAGNOSIS — E7800 Pure hypercholesterolemia, unspecified: Secondary | ICD-10-CM

## 2014-09-10 DIAGNOSIS — G4733 Obstructive sleep apnea (adult) (pediatric): Secondary | ICD-10-CM

## 2014-09-10 DIAGNOSIS — I10 Essential (primary) hypertension: Secondary | ICD-10-CM | POA: Diagnosis not present

## 2014-09-10 DIAGNOSIS — I359 Nonrheumatic aortic valve disorder, unspecified: Secondary | ICD-10-CM

## 2014-09-10 MED ORDER — RAMIPRIL 10 MG PO CAPS: 10.0000 mg | ORAL_CAPSULE | Freq: Every day | ORAL | Status: DC

## 2014-09-10 NOTE — Progress Notes (Signed)
Cardiology Office Note   Date:  09/10/2014   ID:  Cheryl Schultz, DOB 10/24/1965, MRN [id number]  PCP:  Astrid Divine, MD    Chief Complaint  Patient presents with  . Follow-up    OSA      History of Present Illness: Cheryl Schultz is a 49 y.o. female with mild AI, mild OSA on CPAP, dyslipidemia and HTN who presents today for followup. She is doing well. She tolerates her CPAP device well. She tolerates her full face mask and feels the pressure is adequate. She feels rested in the am for the most part and has no daytime sleepiness. She denies any chest pain, SOB, DOE, LE edema, palpitations, dizziness or syncope    Past Medical History  Diagnosis Date  . Hypercholesteremia   . Anxiety   . OSA (obstructive sleep apnea)     mlid obstructive sleep apnea with AHI 11.7/hr and titrated on CPAP to 11cm H2O  . HTN (hypertension)   . Aortic valve disorder   . Heart murmur   . Diabetes   . Aortic insufficiency     mild by echo 3/15    Past Surgical History  Procedure Laterality Date  . Shoulder surgery       Current Outpatient Prescriptions  Medication Sig Dispense Refill  . amLODipine (NORVASC) 10 MG tablet Take 1 tablet (10 mg total) by mouth daily. 30 tablet 11  . Calcium Citrate-Vitamin D (CITRACAL + D PO) Take 2 tablets by mouth daily.    . Cholecalciferol (VITAMIN D) 2000 UNITS CAPS Take by mouth.    . fluticasone (FLONASE) 50 MCG/ACT nasal spray Place into both nostrils as needed.     Marland Kitchen glucose blood test strip 1 each by Other route as needed for other. Use as instructed    . ibuprofen (ADVIL,MOTRIN) 200 MG tablet Take 200 mg by mouth every 6 (six) hours as needed.    . Omega-3 Fatty Acids (FISH OIL) 1000 MG CAPS Take 7,000 mg by mouth daily.    Marland Kitchen PARoxetine (PAXIL) 20 MG tablet Take 20 mg by mouth daily.    . ramipril (ALTACE) 10 MG capsule Take 1 capsule (10 mg total) by mouth daily. 30 capsule 0  . rosuvastatin (CRESTOR) 10 MG tablet  Take 1 tablet (10 mg total) by mouth daily. 30 tablet 6   No current facility-administered medications for this visit.    Allergies:   Sulfa antibiotics; Trilipix; and Niaspan    Social History:  The patient  reports that she has been smoking Cigarettes.  She has been smoking about 1.00 pack per day. She does not have any smokeless tobacco history on file. She reports that she does not drink alcohol or use illicit drugs.   Family History:  The patient's family history includes Diabetes in her father; Heart disease in her father; Hyperlipidemia in her father.    ROS:  Please see the history of present illness.   Otherwise, review of systems are positive for none.   All other systems are reviewed and negative.    PHYSICAL EXAM: VS:  BP 120/56 mmHg  Pulse 86  Ht  (1.575 m)  Wt 157 lb 12.8 oz (71.578 kg)  BMI 28.85 kg/m2  SpO2 97%  LMP 10/05/2010 , BMI Body mass index is 28.85 kg/(m^2). GEN: Well nourished, well developed, in no acute distress HEENT: normal Neck: no JVD, carotid bruits, or  masses Cardiac: RRR; no murmurs, rubs, or gallops,no edema  Respiratory:  clear to auscultation bilaterally, normal work of breathing GI: soft, nontender, nondistended, + BS MS: no deformity or atrophy Skin: warm and dry, no rash Neuro:  Strength and sensation are intact Psych: euthymic mood, full affect   EKG:  EKG is not ordered today.    Recent Labs: 01/31/2014: ALT 25; BUN 21; Creatinine, Ser 0.7; Potassium 3.5; Sodium 135    Lipid Panel    Component Value Date/Time   CHOL 138 01/31/2014 1113   CHOL 83 12/06/2012 0915   TRIG 204.0* 01/31/2014 1113   TRIG 97 12/06/2012 0915   HDL 35.20* 01/31/2014 1113   HDL 36* 12/06/2012 0915   CHOLHDL 4 01/31/2014 1113   VLDL 40.8* 01/31/2014 1113   LDLCALC 44 07/28/2013 1020   LDLCALC 28 12/06/2012 0915   LDLDIRECT 64.7 01/31/2014 1113      Wt Readings from Last 3 Encounters:  09/10/14 157 lb 12.8 oz (71.578 kg)  01/12/14 159 lb  9.6 oz (72.394 kg)  03/21/13 163 lb (73.936 kg)     ASSESSMENT AND PLAN:  1. OSA on CPAP - d/l today showed an AHI of 4/hr on 11cm H2O and 100% compliance in using more than 4 hours nightly.  2. Mild AI/MR by echo 3/15 3. HTN- well controlled - continue amlodipine/ramipril 4. Obesity 5. Dyslipidemia - LDL at goal at 56 on 06/19/2014 by PCP - continue Crestor   Current medicines are reviewed at length with the patient today.  The patient does not have concerns regarding medicines.  The following changes have been made:  no change  Labs/ tests ordered today: See above Assessment and Plan No orders of the defined types were placed in this encounter.     Disposition:   FU with me in 1 year  Signed, Quintella ReichertURNER,Kim Oki R, MD  09/10/2014 3:58 PM    [facility] [address], [place], [place]  [address] Phone: [phone](336) [phone]; Fax: [phone]

## 2014-09-10 NOTE — Patient Instructions (Addendum)
Medication Instructions:  No changes today  Labwork: None today  Testing/Procedures: None today  Follow-Up: Your physician wants you to follow-up in: 1 year with Dr Turner. (July 2017). You will receive a reminder letter in the mail two months in advance. If you don't receive a letter, please call our office to schedule the follow-up appointment.    Thank you for choosing [facility]!!            

## 2014-09-11 NOTE — Addendum Note (Signed)
Addended by: Jacqlyn KraussLANKFORD, Furman Trentman M on: 09/11/2014 03:09 PM   Modules accepted: Orders

## 2014-09-14 ENCOUNTER — Encounter: Payer: Self-pay | Admitting: Cardiology

## 2015-02-27 ENCOUNTER — Other Ambulatory Visit: Payer: Self-pay | Admitting: Cardiology

## 2015-04-02 ENCOUNTER — Other Ambulatory Visit: Payer: Self-pay | Admitting: Cardiology

## 2015-04-03 ENCOUNTER — Other Ambulatory Visit: Payer: Self-pay | Admitting: *Deleted

## 2015-04-03 ENCOUNTER — Telehealth: Payer: Self-pay | Admitting: Cardiology

## 2015-04-03 MED ORDER — ROSUVASTATIN CALCIUM 10 MG PO TABS: 10.0000 mg | ORAL_TABLET | Freq: Every day | ORAL | Status: DC

## 2015-04-03 NOTE — Telephone Encounter (Signed)
New message       *STAT* If patient is at the pharmacy, call can be transferred to refill team.   1. Which medications need to be refilled? (please list name of each medication and dose if known) generic crestor 2. Which pharmacy/location (including street and city if local pharmacy) is medication to be sent to?  [facility] [address] 3. Do they need a 30 day or 90 day supply? 90 day supply

## 2015-08-15 ENCOUNTER — Other Ambulatory Visit: Payer: Self-pay | Admitting: Gastroenterology

## 2015-09-30 ENCOUNTER — Other Ambulatory Visit: Payer: Self-pay | Admitting: Cardiology

## 2015-09-30 DIAGNOSIS — E7800 Pure hypercholesterolemia, unspecified: Secondary | ICD-10-CM

## 2015-09-30 DIAGNOSIS — G4733 Obstructive sleep apnea (adult) (pediatric): Secondary | ICD-10-CM

## 2015-09-30 DIAGNOSIS — I10 Essential (primary) hypertension: Secondary | ICD-10-CM

## 2015-09-30 DIAGNOSIS — I359 Nonrheumatic aortic valve disorder, unspecified: Secondary | ICD-10-CM

## 2015-10-28 ENCOUNTER — Other Ambulatory Visit: Payer: Self-pay | Admitting: Cardiology

## 2015-11-07 ENCOUNTER — Encounter: Payer: Self-pay | Admitting: Cardiology

## 2015-11-20 ENCOUNTER — Ambulatory Visit: Payer: Self-pay | Admitting: Cardiology

## 2015-11-21 ENCOUNTER — Ambulatory Visit (INDEPENDENT_AMBULATORY_CARE_PROVIDER_SITE_OTHER): Payer: BLUE CROSS/BLUE SHIELD | Admitting: Cardiology

## 2015-11-21 ENCOUNTER — Encounter (INDEPENDENT_AMBULATORY_CARE_PROVIDER_SITE_OTHER): Payer: Self-pay

## 2015-11-21 VITALS — BP 122/60 | HR 80 | Ht 63.0 in | Wt 158.4 lb

## 2015-11-21 DIAGNOSIS — I351 Nonrheumatic aortic (valve) insufficiency: Secondary | ICD-10-CM

## 2015-11-21 DIAGNOSIS — I10 Essential (primary) hypertension: Secondary | ICD-10-CM

## 2015-11-21 DIAGNOSIS — G4733 Obstructive sleep apnea (adult) (pediatric): Secondary | ICD-10-CM | POA: Diagnosis not present

## 2015-11-21 NOTE — Progress Notes (Signed)
Cardiology Office Note    Date:  11/21/2015   ID:  [name], DOB 05/13/1965, MRN [id number]  PCP:  Astrid Divine, MD  Cardiologist:  Armanda Magic, MD   Chief Complaint  Patient presents with  . Sleep Apnea  . Hypertension    History of Present Illness:  Cheryl Schultz is a 50 y.o. female with mild AI, mild OSA on CPAP, dyslipidemia and HTN who presents today for followup. She is doing well. She tolerates her CPAP device well. She tolerates her full face mask and feels the pressure is adequate. She feels rested in the am for the most part if she has slept well the night before and does occasionally nap during the day. She denies any chest pain, SOB, DOE, LE edema, palpitations, dizziness or syncope   Past Medical History:  Diagnosis Date  . Anxiety   . Aortic insufficiency    mild by echo 3/15  . Aortic valve disorder   . Diabetes (HCC)   . Heart murmur   . HTN (hypertension)   . Hypercholesteremia   . OSA (obstructive sleep apnea)    mlid obstructive sleep apnea with AHI 11.7/hr and titrated on CPAP to 11cm H2O    Past Surgical History:  Procedure Laterality Date  . SHOULDER SURGERY      Current Medications: Outpatient Medications Prior to Visit  Medication Sig Dispense Refill  . amLODipine (NORVASC) 10 MG tablet TAKE ONE TABLET BY MOUTH ONCE DAILY 30 tablet 0  . Calcium Citrate-Vitamin D (CITRACAL + D PO) Take 2 tablets by mouth daily.    . Cholecalciferol (VITAMIN D) 2000 UNITS CAPS Take 1 capsule by mouth daily.     . fluticasone (FLONASE) 50 MCG/ACT nasal spray Place 1 spray into both nostrils daily.     Marland Kitchen glucose blood test strip 1 each by Other route as needed for other. Use as instructed    . ibuprofen (ADVIL,MOTRIN) 200 MG tablet Take 200 mg by mouth every 6 (six) hours as needed (pain).     . Omega-3 Fatty Acids (FISH OIL) 1000 MG CAPS Take 7,000 mg by mouth daily.    Marland Kitchen PARoxetine (PAXIL) 20 MG tablet Take 20 mg by mouth daily.    .  ramipril (ALTACE) 10 MG capsule TAKE ONE CAPSULE BY MOUTH ONCE DAILY 30 capsule 2  . rosuvastatin (CRESTOR) 10 MG tablet TAKE ONE TABLET BY MOUTH ONCE DAILY 90 tablet 0   No facility-administered medications prior to visit.      Allergies:   Sulfa antibiotics; Trilipix [choline fenofibrate]; and Niaspan [niacin er]   Social History   Social History  . Marital status: Married    Spouse name: N/A  . Number of children: N/A  . Years of education: N/A   Social History Main Topics  . Smoking status: Current Every Day Smoker    Packs/day: 1.00    Types: Cigarettes  . Smokeless tobacco: Never Used  . Alcohol use No  . Drug use: No  . Sexual activity: Not on file   Other Topics Concern  . Not on file   Social History Narrative  . No narrative on file     Family History:  The patient's family history includes Diabetes in her father; Heart disease in her father; Hyperlipidemia in her father.   ROS:   Please see the history of present illness.    ROS All other systems reviewed and are negative.  No flowsheet data found.     PHYSICAL  EXAM:   VS:  BP 122/60   Pulse 80   Ht 5\' 3"  (1.6 m)   Wt 158 lb 6.4 oz (71.8 kg)   LMP 10/05/2010   BMI 28.06 kg/m    GEN: Well nourished, well developed, in no acute distress  HEENT: normal  Neck: no JVD, carotid bruits, or masses Cardiac: RRR; no rubs, or gallops,no edema.  Intact distal pulses bilaterally.   2/6 Sm at RUSB and LLSB Respiratory:  clear to auscultation bilaterally, normal work of breathing GI: soft, nontender, nondistended, + BS MS: no deformity or atrophy  Skin: warm and dry, no rash Neuro:  Alert and Oriented x 3, Strength and sensation are intact Psych: euthymic mood, full affect  Wt Readings from Last 3 Encounters:  11/21/15 158 lb 6.4 oz (71.8 kg)  09/10/14 157 lb 12.8 oz (71.6 kg)  01/12/14 159 lb 9.6 oz (72.4 kg)      Studies/Labs Reviewed:   EKG:  EKG is not ordered today.    Recent Labs: No results  found for requested labs within last 8760 hours.   Lipid Panel    Component Value Date/Time   CHOL 138 01/31/2014 1113   CHOL 83 12/06/2012 0915   TRIG 204.0 (H) 01/31/2014 1113   TRIG 97 12/06/2012 0915   HDL 35.20 (L) 01/31/2014 1113   HDL 36 (L) 12/06/2012 0915   CHOLHDL 4 01/31/2014 1113   VLDL 40.8 (H) 01/31/2014 1113   LDLCALC 44 07/28/2013 1020   LDLCALC 28 12/06/2012 0915   LDLDIRECT 64.7 01/31/2014 1113    Additional studies/ records that were reviewed today include:  none    ASSESSMENT:    1. OSA (obstructive sleep apnea)   2. Essential hypertension   3. Aortic insufficiency      PLAN:  In order of problems listed above:  OSA - the patient is tolerating PAP therapy well without any problems. The PAP download was reviewed today and showed an AHI of 4.9/hr on 11 cm H2O with 100% compliance in using more than 4 hours nightly.  The patient has been using and benefiting from CPAP use and will continue to benefit from therapy.  2.   HTN- BP controlled on current meds. Continue amlodipine/ACE I. 3.   Aortic insufficiency - mild AR/mild MR on echo 2 years ago.  Will repeat echo to make sure this is stable.  Medication Adjustments/Labs and Tests Ordered: Current medicines are reviewed at length with the patient today.  Concerns regarding medicines are outlined above.  Medication changes, Labs and Tests ordered today are listed in the Patient Instructions below.  There are no Patient Instructions on file for this visit.   Signed, Armanda Magicraci Ramone Gander, MD  11/21/2015 1:35 PM    [facility] [address], [place], [place]  [address] Phone: [phone]) [phone]; Fax: [phone]

## 2015-11-21 NOTE — Patient Instructions (Signed)
Medication Instructions:  Your physician recommends that you continue on your current medications as directed. Please refer to the Current Medication list given to you today.   Labwork: -None  Testing/Procedures: Your physician has requested that you have an echocardiogram. Echocardiography is a painless test that uses sound waves to create images of your heart. It provides your doctor with information about the size and shape of your heart and how well your heart's chambers and valves are working. This procedure takes approximately one hour. There are no restrictions for this procedure. Pt will call back to schedule echo due to financial reasons.   Follow-Up: Your physician wants you to follow-up in: 1 year with Dr.Turner.  You will receive a reminder letter in the mail two months in advance. If you don't receive a letter, please call our office to schedule the follow-up appointment.   Any Other Special Instructions Will Be Listed Below (If Applicable).     If you need a refill on your cardiac medications before your next appointment, please call your pharmacy.

## 2015-11-26 ENCOUNTER — Encounter: Payer: Self-pay | Admitting: Cardiology

## 2015-12-30 ENCOUNTER — Other Ambulatory Visit: Payer: Self-pay | Admitting: Cardiology

## 2015-12-30 DIAGNOSIS — I10 Essential (primary) hypertension: Secondary | ICD-10-CM

## 2015-12-30 DIAGNOSIS — G4733 Obstructive sleep apnea (adult) (pediatric): Secondary | ICD-10-CM

## 2015-12-30 DIAGNOSIS — E7800 Pure hypercholesterolemia, unspecified: Secondary | ICD-10-CM

## 2015-12-30 DIAGNOSIS — I359 Nonrheumatic aortic valve disorder, unspecified: Secondary | ICD-10-CM

## 2016-04-30 ENCOUNTER — Other Ambulatory Visit: Payer: Self-pay | Admitting: Cardiology

## 2016-05-15 ENCOUNTER — Encounter: Payer: Self-pay | Admitting: Cardiology

## 2016-10-26 ENCOUNTER — Other Ambulatory Visit: Payer: Self-pay | Admitting: Cardiology

## 2016-12-17 ENCOUNTER — Encounter: Payer: Self-pay | Admitting: Neurology

## 2017-03-31 ENCOUNTER — Ambulatory Visit: Payer: BLUE CROSS/BLUE SHIELD | Admitting: Neurology

## 2017-07-09 ENCOUNTER — Encounter: Payer: Self-pay | Admitting: Neurology

## 2017-07-09 ENCOUNTER — Ambulatory Visit (INDEPENDENT_AMBULATORY_CARE_PROVIDER_SITE_OTHER): Payer: BLUE CROSS/BLUE SHIELD | Admitting: Neurology

## 2017-07-09 VITALS — BP 132/64 | HR 77 | Ht 61.0 in | Wt 155.0 lb

## 2017-07-09 DIAGNOSIS — R937 Abnormal findings on diagnostic imaging of other parts of musculoskeletal system: Secondary | ICD-10-CM | POA: Diagnosis not present

## 2017-07-09 NOTE — Progress Notes (Signed)
NEUROLOGY CONSULTATION NOTE  Cheryl Schultz MRN: [id number] DOB: 11-21-1965  Referring provider: Dr. Thomasena Edis Primary care provider: Dr. Valentina Lucks  Reason for consult:  Abnormality on cervical MRI  HISTORY OF PRESENT ILLNESS: Cheryl Schultz is a 52 year old right-handed female who presents for abnormal finding on cervical MRI.  She is accompanied by her husband and daughter who supplement history.  She is being treated at [facility] for right sided neck and shoulder pain with radiculopathy.  An MRI of the cervical spine was performed on 12/10/16, which was personally reviewed and demonstrated focus of signal abnormality in the cerebellum on the left.  She denies known history of stroke.  She once had an episode of dizziness which was reportedly attributed to low blood sugar.  However, she has no history of acute vertigo, ataxia or unilateral numbness or weakness.  She has history of hypertension, hypercholesterolemia and prediabetes.  She is a smoker.  Blood pressure and cholesterol are controlled via medication.  She is modifying her diet to try and optimize her glycemic control.  She is not on antiplatelet therapy.   Her paternal aunt had a stroke in her mid to late 68s.    PAST MEDICAL HISTORY: Past Medical History:  Diagnosis Date  . Anxiety   . Aortic insufficiency    mild by echo 3/15  . Aortic valve disorder   . Diabetes (HCC)   . Heart murmur   . HTN (hypertension)   . Hypercholesteremia   . OSA (obstructive sleep apnea)    mlid obstructive sleep apnea with AHI 11.7/hr and titrated on CPAP to 11cm H2O    PAST SURGICAL HISTORY: Past Surgical History:  Procedure Laterality Date  . SHOULDER SURGERY      MEDICATIONS: Current Outpatient Medications on File Prior to Visit  Medication Sig Dispense Refill  . amLODipine (NORVASC) 10 MG tablet TAKE ONE TABLET BY MOUTH ONCE DAILY 30 tablet 0  . Calcium Citrate-Vitamin D (CITRACAL + D PO) Take 2 tablets by mouth  daily.    . Cholecalciferol (VITAMIN D) 2000 UNITS CAPS Take 1 capsule by mouth daily.     . fluticasone (FLONASE) 50 MCG/ACT nasal spray Place 1 spray into both nostrils daily.     Marland Kitchen glucose blood test strip 1 each by Other route as needed for other. Use as instructed    . ibuprofen (ADVIL,MOTRIN) 200 MG tablet Take 200 mg by mouth every 6 (six) hours as needed (pain).     . Omega-3 Fatty Acids (FISH OIL) 1000 MG CAPS Take 7,000 mg by mouth daily.    Marland Kitchen omeprazole (PRILOSEC) 40 MG capsule Take 40 mg by mouth daily.    Marland Kitchen PARoxetine (PAXIL) 20 MG tablet Take 20 mg by mouth daily.    . ramipril (ALTACE) 10 MG capsule TAKE ONE CAPSULE BY MOUTH ONCE DAILY 90 capsule 3  . rosuvastatin (CRESTOR) 10 MG tablet TAKE 1 TABLET BY MOUTH ONCE DAILY 30 tablet 0   No current facility-administered medications on file prior to visit.     ALLERGIES: Allergies  Allergen Reactions  . Sulfa Antibiotics Hives and Shortness Of Breath  . Trilipix [Choline Fenofibrate] Hives and Shortness Of Breath  . Methocarbamol Swelling  . Niaspan [Niacin Er] Hives    FAMILY HISTORY: Family History  Problem Relation Age of Onset  . Osteoarthritis Mother   . Heart disease Father   . Diabetes Father   . Hyperlipidemia Father     SOCIAL HISTORY: Social History  Socioeconomic History  . Marital status: Married    Spouse name: Isaias Cowman  . Number of children: 1  . Years of education: Not on file  . Highest education level: 12th grade  Occupational History  . Occupation: IT  Social Needs  . Financial resource strain: Not on file  . Food insecurity:    Worry: Not on file    Inability: Not on file  . Transportation needs:    Medical: Not on file    Non-medical: Not on file  Tobacco Use  . Smoking status: Current Every Day Smoker    Packs/day: 1.00    Types: Cigarettes  . Smokeless tobacco: Never Used  Substance and Sexual Activity  . Alcohol use: No    Alcohol/week: 0.0 oz  . Drug use: No  . Sexual  activity: Not on file  Lifestyle  . Physical activity:    Days per week: Not on file    Minutes per session: Not on file  . Stress: Not on file  Relationships  . Social connections:    Talks on phone: Not on file    Gets together: Not on file    Attends religious service: Not on file    Active member of club or organization: Not on file    Attends meetings of clubs or organizations: Not on file    Relationship status: Not on file  . Intimate partner violence:    Fear of current or ex partner: Not on file    Emotionally abused: Not on file    Physically abused: Not on file    Forced sexual activity: Not on file  Other Topics Concern  . Not on file  Social History Narrative   Pt is married, lives with husband and daughter. She drinks 5 cups of coffee a day. Walks daily in her yard.    REVIEW OF SYSTEMS: Constitutional: No fevers, chills, or sweats, no generalized fatigue, change in appetite Eyes: No visual changes, double vision, eye pain Ear, nose and throat: No hearing loss, ear pain, nasal congestion, sore throat Cardiovascular: No chest pain, palpitations Respiratory:  No shortness of breath at rest or with exertion, wheezes GastrointestinaI: No nausea, vomiting, diarrhea, abdominal pain, fecal incontinence Genitourinary:  No dysuria, urinary retention or frequency Musculoskeletal:  Neck pain, some back pain Integumentary: No rash, pruritus, skin lesions Neurological: as above Psychiatric: No depression, insomnia, anxiety Endocrine: No palpitations, fatigue, diaphoresis, mood swings, change in appetite, change in weight, increased thirst Hematologic/Lymphatic:  No purpura, petechiae. Allergic/Immunologic: no itchy/runny eyes, nasal congestion, recent allergic reactions, rashes  PHYSICAL EXAM: Vitals:   07/09/17 0806  BP: 132/64  Pulse: 77  SpO2: 95%   General: No acute distress.  Patient appears well-groomed.   Head:  Normocephalic/atraumatic Eyes:  fundi examined  but not visualized Neck: supple, paraspinal tenderness, full range of motion Back: No paraspinal tenderness Heart: regular rate and rhythm Lungs: Clear to auscultation bilaterally. Vascular: No carotid bruits. Neurological Exam: Mental status: alert and oriented to person, place, and time, recent and remote memory intact, fund of knowledge intact, attention and concentration intact, speech fluent and not dysarthric, language intact. Cranial nerves: CN I: not tested CN II: pupils equal, round and reactive to light, visual fields intact CN III, IV, VI:  full range of motion, no nystagmus, no ptosis CN V: facial sensation intact CN VII: upper and lower face symmetric CN VIII: hearing intact CN IX, X: gag intact, uvula midline CN XI: sternocleidomastoid and trapezius muscles intact CN  XII: tongue midline Bulk & Tone: normal, no fasciculations. Motor:  5/5 throughout  Sensation:  Pinprick sensation slightly reduced on right upper extremity and vibration sensation slightly reduced in right upper and lower extremities. Deep Tendon Reflexes:  3+ throughout, toes downgoing.  Finger to nose testing:  Without dysmetria.   Heel to shin:  Without dysmetria.   Gait:  Normal station and stride.  Able to turn and tandem walk. Romberg negative.  IMPRESSION: Signal abnormality in cerebellum on cervical MRI, incidental finding.  Chronic ischemia possible as she has risk factors for cerebrovascular disease.  PLAN: 1.  We will get MRI of brain with and without contrast to better visualize findings. 2.  If old stroke confirmed, then I would recommend starting ASA  daily, quit smoking, optimize glycemic control and continue control of blood pressure and cholesterol 3.  If findings suggest alternative diagnosis, follow up and further recommendations will be made.  Thank you for allowing me to take part in the care of this patient.  Shon Millet, DO  CC:  Maurice Small, MD  Valma Cava,  MD

## 2017-07-09 NOTE — Patient Instructions (Addendum)
It is unclear what may be the finding on the MRI since it is not looking specifically at the brain.  It may be artifact, it may be old incidental stroke.  It may be something else but I cannot tell.  We will get MRI of brain with and without contrast to further evaluate.  If it is evidence of old stroke, then I would start aspirin EC  daily and continue control of blood pressure and cholesterol and try to improve control of blood sugars.  We will let you know if findings require follow up.  We have sent a referral to [facility] for your MRI and they will call you directly to schedule your appt. They are located at [address] [facility]. If you need to contact them directly please call [phone].

## 2017-08-02 ENCOUNTER — Ambulatory Visit
Admission: RE | Admit: 2017-08-02 | Discharge: 2017-08-02 | Disposition: A | Discharge status: Home / Self Care | Payer: BLUE CROSS/BLUE SHIELD | Source: Ambulatory Visit | Attending: Neurology | Admitting: Neurology

## 2017-08-02 DIAGNOSIS — R937 Abnormal findings on diagnostic imaging of other parts of musculoskeletal system: Secondary | ICD-10-CM

## 2017-08-02 MED ORDER — GADOBENATE DIMEGLUMINE 529 MG/ML IV SOLN
15.0000 mL | Freq: Once | INTRAVENOUS | Status: AC | PRN
  Administered 2017-08-02: 15 mL via INTRAVENOUS

## 2017-08-04 ENCOUNTER — Telehealth: Payer: Self-pay

## 2017-08-04 NOTE — Telephone Encounter (Signed)
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  This is an incidental finding and uncertain when this occurred (it could have been years ago).  Sometimes people may develop small strokes that don't present with any symptoms.  In any event, I would recommend starting aspirin 81mg  daily.  She should continue medication to manage her blood pressure and cholesterol.  Other than that, it does not require further testing.

## 2017-08-04 NOTE — Telephone Encounter (Signed)
Called and spoke with Pt, advsd her MRI results. Pt will start 81mg  ASA

## 2020-02-13 IMAGING — MR MR HEAD WO/W CM
14 series · 48 of 48 positions shown · IV contrast (multihance)
Comparison: None.

CLINICAL DATA: Abnormal cervical MRI.  Question cerebellar stroke

EXAM:
MRI HEAD WITHOUT AND WITH CONTRAST
TECHNIQUE: Multiplanar, multiecho pulse sequences of the brain and surrounding
structures were obtained without and with intravenous contrast.
Creatinine was obtained on site at [HOSPITAL] at [HOSPITAL].
Results: Creatinine 0.7 mg/dL.
CONTRAST:  15mL MULTIHANCE GADOBENATE DIMEGLUMINE 529 MG/ML IV SOLN

[Series 2: t1_se_sag · sagittal · 5.0mm · 0.45mm/px · 1 of 21 slices shown]
[im 1/21]
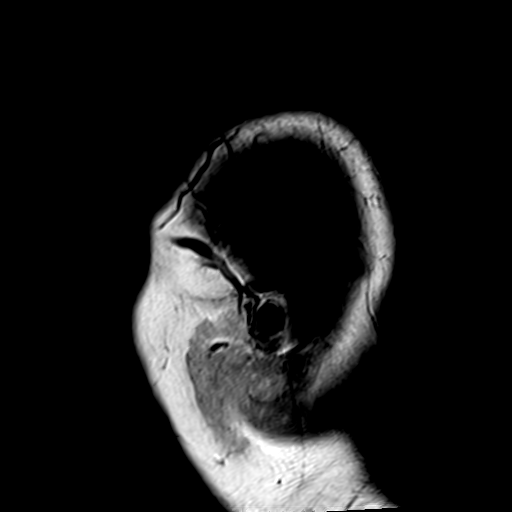

[Series 3: ep2d_diff_(id)_trace · axial · 3.0mm · 1.80mm/px · z∈[-34,+107]mm · 5 of 96 slices shown]
[im 1/96]
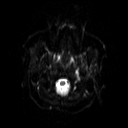
[im 24/96]
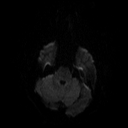
[im 48/96]
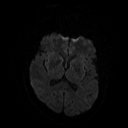
[im 72/96]
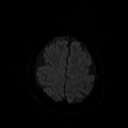
[im 96/96]
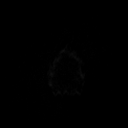

[Series 4: ep2d_diff_(id)_trace_adc · axial · 3.0mm · 1.80mm/px · z∈[-34,+107]mm · 2 of 48 slices shown]
[im 1/48]
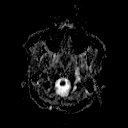
[im 48/48]
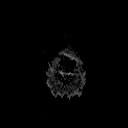

[Series 5: ep2d_diff_cor · coronal · 5.0mm · 1.77mm/px · 3 of 48 slices shown]
[im 1/48]
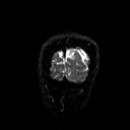
[im 24/48]
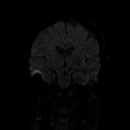
[im 48/48]
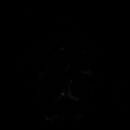

[Series 6: ep2d_diff_cor_adc · coronal · 5.0mm · 1.77mm/px · 1 of 24 slices shown]
[im 1/24]
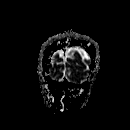

[Series 8: swi_images · axial · 2.0mm · 0.90mm/px · z∈[-42,+116]mm · 5 of 80 slices shown]
[im 1/80]
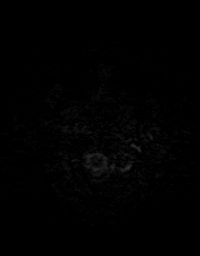
[im 20/80]
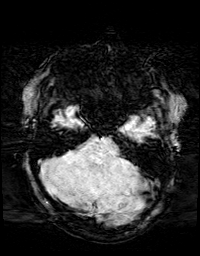
[im 40/80]
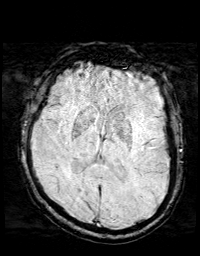
[im 60/80]
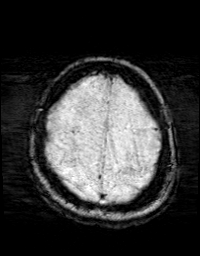
[im 80/80]
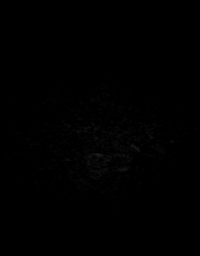

[Series 9: FLAIR · axial · 3.0mm · 0.43mm/px · z∈[-42,+114]mm · 2 of 27 slices shown (1 of 3)]
[im 1/27]
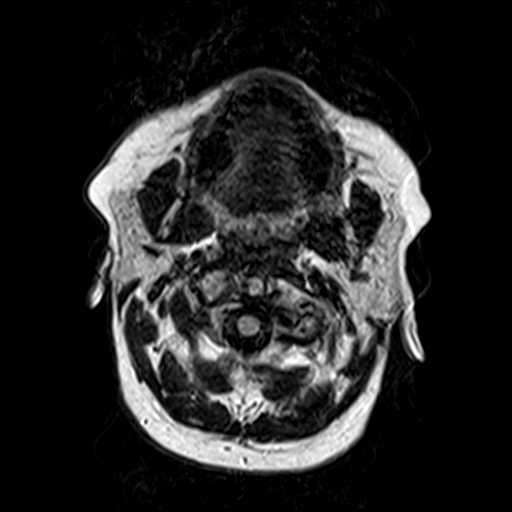
[im 27/27]
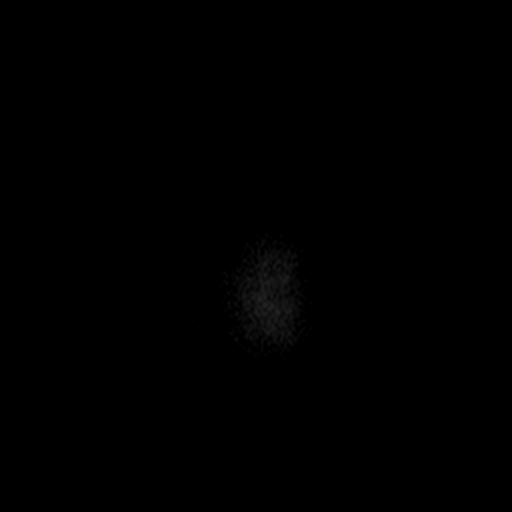

[Series 10: t2_tse_tra_512 · axial · 5.0mm · 0.60mm/px · 1 of 24 slices shown]
[im 1/24]
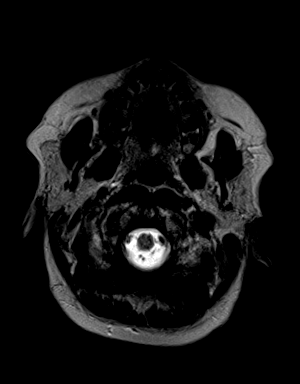

[Series 11: FLAIR · sagittal · 5.0mm · 0.45mm/px · 2 of 25 slices shown (2 of 3)]
[im 1/25]
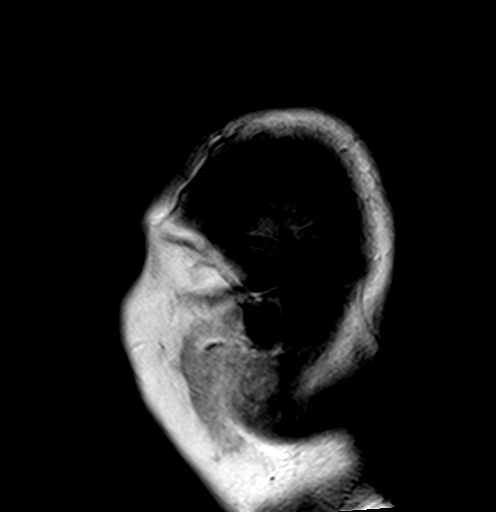
[im 25/25]
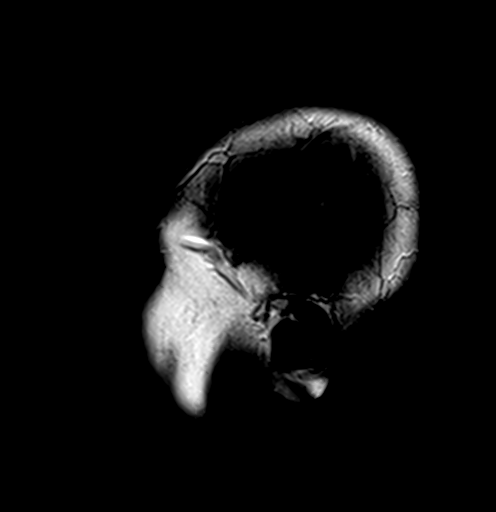

[Series 12: t1_mpr_tra · axial · 1.0mm · 0.72mm/px · z∈[-42,+116]mm · 10 of 160 slices shown]
[im 1/160]
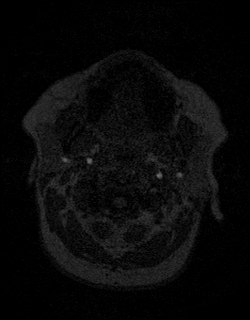
[im 18/160]
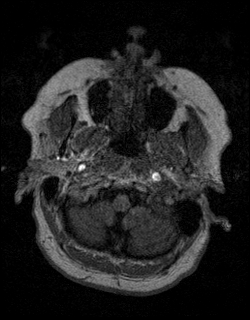
[im 36/160]
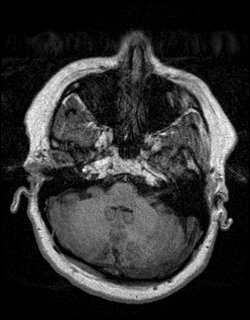
[im 54/160]
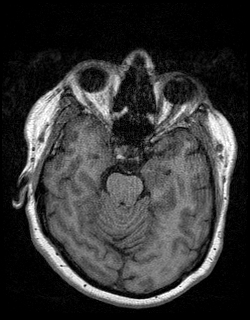
[im 71/160]
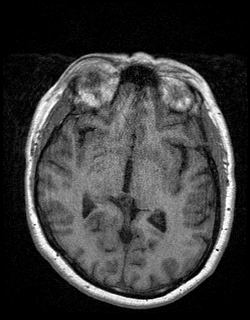
[im 89/160]
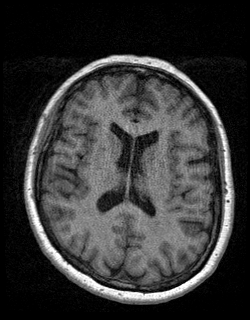
[im 107/160]
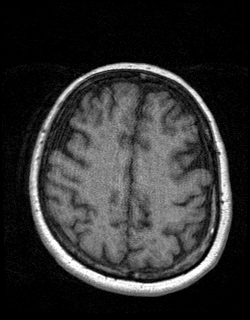
[im 124/160]
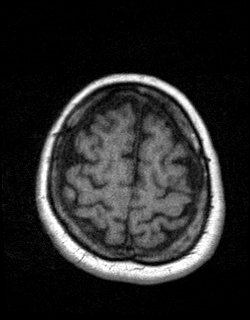
[im 142/160]
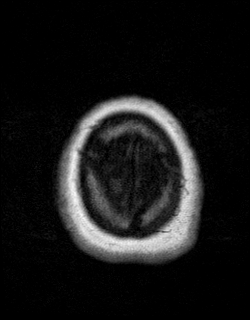
[im 160/160]
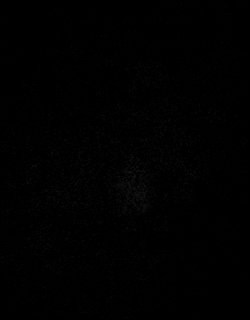

[Series 13: T2 · coronal · 5.0mm · 0.45mm/px · 2 of 26 slices shown]
[im 1/26]
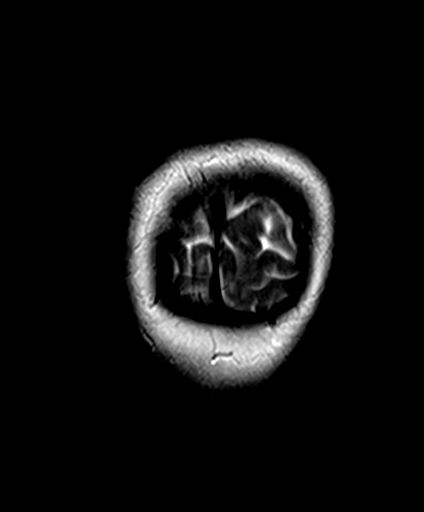
[im 26/26]
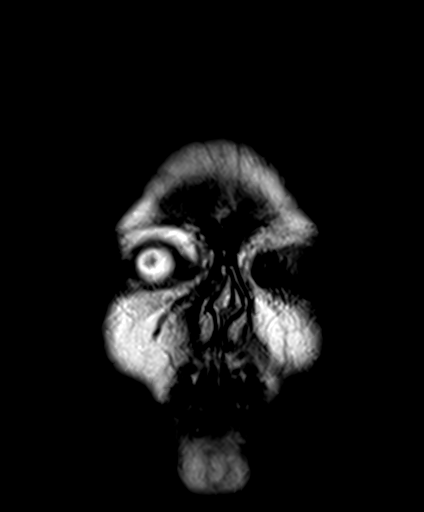

[Series 14: T1 post-contrast · coronal · 5.0mm · 0.72mm/px · 2 of 26 slices shown]
[im 1/26]
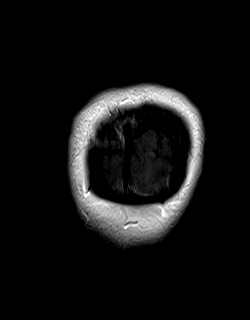
[im 26/26]
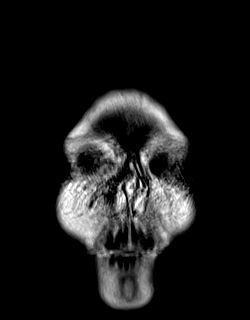

[Series 15: post t1_mpr_tra · axial · 1.0mm · 0.72mm/px · z∈[-42,+116]mm · 10 of 160 slices shown]
[im 1/160]
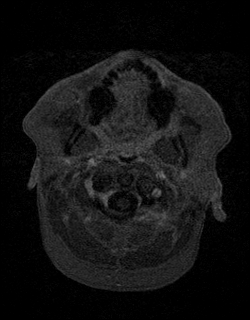
[im 18/160]
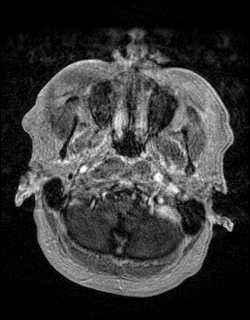
[im 36/160]
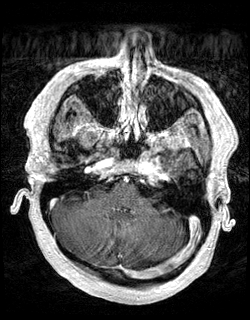
[im 54/160]
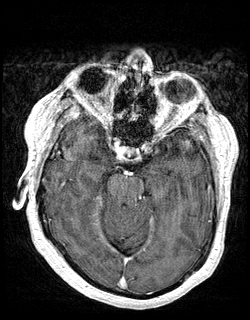
[im 71/160]
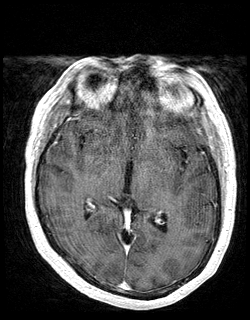
[im 89/160]
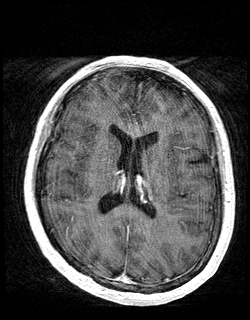
[im 107/160]
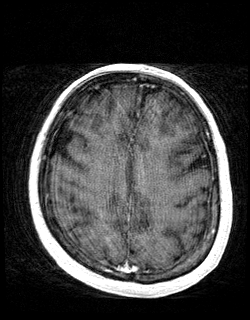
[im 124/160]
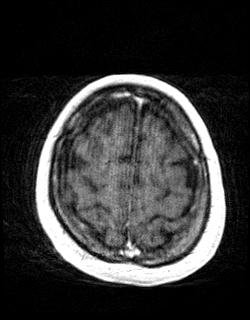
[im 142/160]
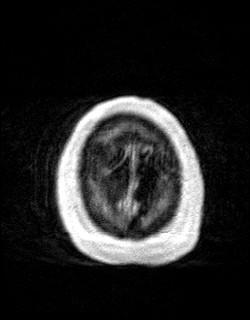
[im 160/160]
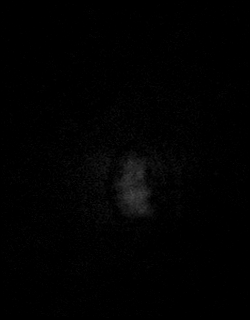

[Series 16: FLAIR · axial · 3.0mm · 0.43mm/px · z∈[-42,+114]mm · 2 of 27 slices shown (3 of 3)]
[im 1/27]
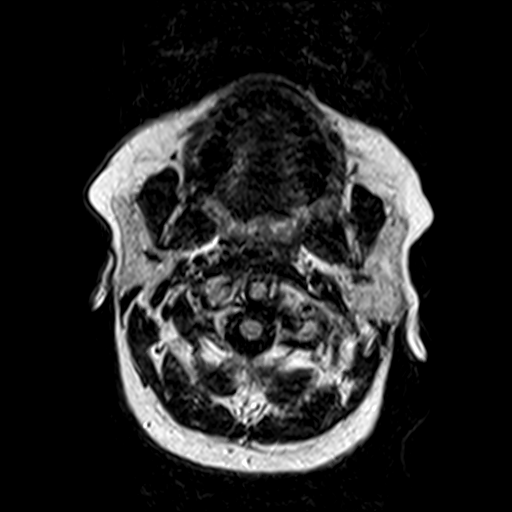
[im 27/27]
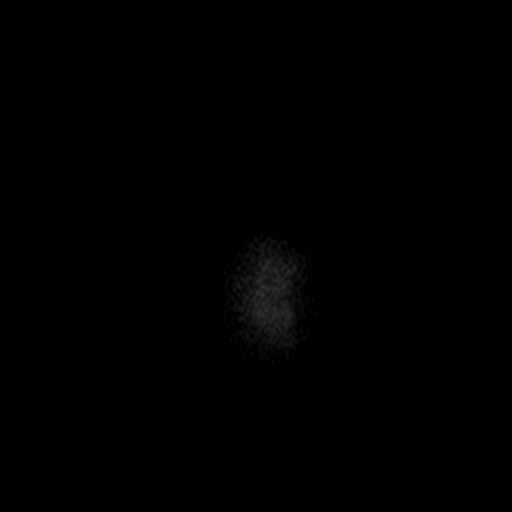

[48 of 48 positions shown; findings below may reference images not displayed]

FINDINGS: Brain: Cluster of small wedge-shaped signal abnormalities in the
left cerebellum consistent with remote infarcts. Two small remote
right cerebellar infarcts. Few FLAIR hyperintensities in the
cerebral white matter, nonspecific but usually chronic small vessel
ischemia given vascular risk factors. No acute infarct, hemorrhage,
hydrocephalus, or masslike finding.

Vascular: Major flow voids and vascular enhancements are preserved

Skull and upper cervical spine: No evidence of marrow lesion.

Sinuses/Orbits: Negative

Other: Motion degraded exam, subtle findings could be missed.
IMPRESSION: 1. Small remote infarcts in the left more than right cerebellum.
2. Mild cerebral white matter disease, nonspecific but likely from
chronic small vessel ischemia given patient's medical history.

## 2021-04-16 ENCOUNTER — Other Ambulatory Visit: Payer: Self-pay | Admitting: Family Medicine

## 2021-04-16 DIAGNOSIS — Z1231 Encounter for screening mammogram for malignant neoplasm of breast: Secondary | ICD-10-CM

## 2021-04-16 DIAGNOSIS — Z13820 Encounter for screening for osteoporosis: Secondary | ICD-10-CM

## 2021-09-10 ENCOUNTER — Ambulatory Visit: Payer: BLUE CROSS/BLUE SHIELD

## 2021-09-10 ENCOUNTER — Other Ambulatory Visit: Payer: BLUE CROSS/BLUE SHIELD

## 2021-11-05 ENCOUNTER — Other Ambulatory Visit: Payer: Self-pay | Admitting: Internal Medicine

## 2021-11-05 DIAGNOSIS — Z122 Encounter for screening for malignant neoplasm of respiratory organs: Secondary | ICD-10-CM

## 2021-11-05 DIAGNOSIS — Z720 Tobacco use: Secondary | ICD-10-CM

## 2022-02-17 ENCOUNTER — Inpatient Hospital Stay: Admission: RE | Admit: 2022-02-17 | Payer: BLUE CROSS/BLUE SHIELD | Source: Ambulatory Visit

## 2022-02-17 ENCOUNTER — Ambulatory Visit: Payer: BLUE CROSS/BLUE SHIELD

## 2022-12-30 ENCOUNTER — Other Ambulatory Visit: Payer: Self-pay | Admitting: Internal Medicine

## 2022-12-30 DIAGNOSIS — F39 Unspecified mood [affective] disorder: Secondary | ICD-10-CM | POA: Diagnosis not present

## 2022-12-30 DIAGNOSIS — N182 Chronic kidney disease, stage 2 (mild): Secondary | ICD-10-CM | POA: Diagnosis not present

## 2022-12-30 DIAGNOSIS — E669 Obesity, unspecified: Secondary | ICD-10-CM | POA: Diagnosis not present

## 2022-12-30 DIAGNOSIS — Z124 Encounter for screening for malignant neoplasm of cervix: Secondary | ICD-10-CM | POA: Diagnosis not present

## 2022-12-30 DIAGNOSIS — K219 Gastro-esophageal reflux disease without esophagitis: Secondary | ICD-10-CM | POA: Diagnosis not present

## 2022-12-30 DIAGNOSIS — Z0000 Encounter for general adult medical examination without abnormal findings: Secondary | ICD-10-CM | POA: Diagnosis not present

## 2022-12-30 DIAGNOSIS — I129 Hypertensive chronic kidney disease with stage 1 through stage 4 chronic kidney disease, or unspecified chronic kidney disease: Secondary | ICD-10-CM | POA: Diagnosis not present

## 2022-12-30 DIAGNOSIS — E1121 Type 2 diabetes mellitus with diabetic nephropathy: Secondary | ICD-10-CM | POA: Diagnosis not present

## 2022-12-30 DIAGNOSIS — E7800 Pure hypercholesterolemia, unspecified: Secondary | ICD-10-CM | POA: Diagnosis not present

## 2022-12-30 DIAGNOSIS — Z13820 Encounter for screening for osteoporosis: Secondary | ICD-10-CM

## 2022-12-30 DIAGNOSIS — Z23 Encounter for immunization: Secondary | ICD-10-CM | POA: Diagnosis not present

## 2022-12-30 DIAGNOSIS — N951 Menopausal and female climacteric states: Secondary | ICD-10-CM | POA: Diagnosis not present

## 2022-12-30 DIAGNOSIS — Z720 Tobacco use: Secondary | ICD-10-CM | POA: Diagnosis not present

## 2022-12-30 DIAGNOSIS — I359 Nonrheumatic aortic valve disorder, unspecified: Secondary | ICD-10-CM | POA: Diagnosis not present

## 2022-12-31 ENCOUNTER — Other Ambulatory Visit: Payer: Self-pay | Admitting: Internal Medicine

## 2022-12-31 DIAGNOSIS — Z1231 Encounter for screening mammogram for malignant neoplasm of breast: Secondary | ICD-10-CM

## 2023-01-01 ENCOUNTER — Other Ambulatory Visit: Payer: Self-pay | Admitting: Internal Medicine

## 2023-01-01 DIAGNOSIS — Z122 Encounter for screening for malignant neoplasm of respiratory organs: Secondary | ICD-10-CM

## 2023-01-01 DIAGNOSIS — Z720 Tobacco use: Secondary | ICD-10-CM
